# Patient Record
Sex: Female | Born: 1935 | Race: White | Hispanic: No | Smoking: Never smoker
Health system: Southern US, Community
[De-identification: ages and names within clinical notes are randomized; demographics above are authoritative.]

---

## 1999-11-29 ENCOUNTER — Ambulatory Visit (HOSPITAL_COMMUNITY): Admission: RE | Admit: 1999-11-29 | Discharge: 1999-11-29 | Payer: Self-pay | Admitting: Ophthalmology

## 2000-11-20 ENCOUNTER — Encounter: Payer: Self-pay | Admitting: Ophthalmology

## 2000-11-20 ENCOUNTER — Ambulatory Visit (HOSPITAL_COMMUNITY): Admission: RE | Admit: 2000-11-20 | Discharge: 2000-11-20 | Payer: Self-pay | Admitting: Ophthalmology

## 2008-08-10 ENCOUNTER — Ambulatory Visit: Payer: Self-pay | Admitting: Family Medicine

## 2008-08-28 ENCOUNTER — Ambulatory Visit: Payer: Self-pay | Admitting: Family Medicine

## 2009-02-22 ENCOUNTER — Ambulatory Visit: Payer: Self-pay | Admitting: Family Medicine

## 2010-03-11 ENCOUNTER — Ambulatory Visit: Payer: Self-pay | Admitting: Family Medicine

## 2014-03-16 ENCOUNTER — Encounter: Payer: Self-pay | Admitting: Rheumatology

## 2014-04-02 ENCOUNTER — Encounter: Payer: Self-pay | Admitting: Rheumatology

## 2016-02-14 ENCOUNTER — Other Ambulatory Visit: Payer: Self-pay | Admitting: Neurology

## 2016-02-14 DIAGNOSIS — R413 Other amnesia: Secondary | ICD-10-CM

## 2016-02-14 DIAGNOSIS — R42 Dizziness and giddiness: Secondary | ICD-10-CM

## 2016-02-24 ENCOUNTER — Ambulatory Visit: Payer: Self-pay

## 2016-02-26 ENCOUNTER — Ambulatory Visit
Admission: RE | Admit: 2016-02-26 | Discharge: 2016-02-26 | Disposition: A | Discharge status: Home / Self Care | Payer: Medicare Other | Source: Ambulatory Visit | Attending: Neurology | Admitting: Neurology

## 2016-02-26 ENCOUNTER — Encounter: Payer: Self-pay | Admitting: Radiology

## 2016-02-26 DIAGNOSIS — R600 Localized edema: Secondary | ICD-10-CM | POA: Diagnosis not present

## 2016-02-26 DIAGNOSIS — H9311 Tinnitus, right ear: Secondary | ICD-10-CM | POA: Insufficient documentation

## 2016-02-26 DIAGNOSIS — R42 Dizziness and giddiness: Secondary | ICD-10-CM | POA: Diagnosis present

## 2016-02-26 DIAGNOSIS — R413 Other amnesia: Secondary | ICD-10-CM | POA: Diagnosis present

## 2018-05-07 ENCOUNTER — Other Ambulatory Visit: Payer: Self-pay | Admitting: Unknown Physician Specialty

## 2018-05-07 DIAGNOSIS — R1310 Dysphagia, unspecified: Secondary | ICD-10-CM

## 2018-05-10 ENCOUNTER — Ambulatory Visit: Payer: Medicare Other

## 2018-05-10 ENCOUNTER — Ambulatory Visit
Admission: RE | Admit: 2018-05-10 | Discharge: 2018-05-10 | Disposition: A | Discharge status: Home / Self Care | Payer: Medicare Other | Source: Ambulatory Visit | Attending: Unknown Physician Specialty | Admitting: Unknown Physician Specialty

## 2018-05-10 DIAGNOSIS — R1312 Dysphagia, oropharyngeal phase: Secondary | ICD-10-CM | POA: Insufficient documentation

## 2018-05-10 NOTE — Therapy (Signed)
[facility] DIAGNOSTIC RADIOLOGY [address] [place], [place], [address] Phone: [phone]   Fax:     Modified Barium Swallow  Patient Details  Name: Aranda Bihm MRN: [id number] Date of Birth: 02-11-1930 No data recorded  Encounter Date: 05/10/2018    No past medical history on file.    There were no vitals filed for this visit.    Subjective: Patient behavior: (alertness, ability to follow instructions, etc.):  The patient is a poor historian secondary dementia.  She is alert and able to follow simple directions.  Chief complaint: The patient's daughter reports frequent paroxymal coughing spells and globus sensation.   Objective:   Radiological Procedure: A videoflouroscopic evaluation of oral-preparatory, reflex initiation, and pharyngeal phases of the swallow was performed; as well as a screening of the upper esophageal phase.  I. POSTURE: Upright in MBS chair  II. VIEW: Lateral  III. COMPENSATORY STRATEGIES: N/A  IV. BOLUSES ADMINISTERED:   Thin Liquid: 1 small, 3 rapid consecutive   Nectar-thick Liquid: 1 moderate   Honey-thick Liquid: DNT   Puree: 2 teaspoon presentations   Mechanical Soft: 1/4 graham cracker in applesauce  V. RESULTS OF EVALUATION: A. ORAL PREPARATORY PHASE: (The lips, tongue, and velum are observed for strength and coordination)       **Overall Severity Rating: within normal limits   B. SWALLOW INITIATION/REFLEX: (The reflex is normal if "triggered" by the time the bolus reached the base of the tongue)  **Overall Severity Rating: Mild; triggers while falling from the valleculae to the pyriform sinuses  C. PHARYNGEAL PHASE: (Pharyngeal function is normal if the bolus shows rapid, smooth, and continuous transit through the pharynx and there is no pharyngeal residue after the swallow)  **Overall Severity Rating: within normal limits   D. LARYNGEAL PENETRATION: (Material entering into the  laryngeal inlet/vestibule but not aspirated) NONE  E. ASPIRATION: NONE  F. ESOPHAGEAL PHASE: (Screening of the upper esophagus) In the cervical esophagus there is a finger-like protrusion along the posterior wall during swallow (does not impede flow of boluses) consistent with prominent cricopharyngeus.    ASSESSMENT: This 84 year old woman; with complaint of globus sensation and frequent paroxysmal coughing spells; is presenting with minimal oropharyngeal dysphagia characterized by delayed pharyngeal swallow initiation.  Oral control of the bolus including oral hold, rotary mastication, and anterior to posterior transfer is within normal limits.   Aspects of the pharyngeal stage of swallowing including tongue base retraction, hyolaryngeal excursion, epiglottic inversion, and duration/amplitude of UES opening are within normal limits.  There is no observed pharyngeal residue, laryngeal penetration, or tracheal aspiration.  The patient is not at significant risk for prandial aspiration and her complaints do not appear to be due to oropharyngeal swallow function.  In the cervical esophagus there is a finger-like protrusion along the posterior wall during swallow (does not impede flow of boluses) consistent with prominent cricopharyngeus.  The patient may benefit from consult with GI.  PLAN/RECOMMENDATIONS:   A. Diet: Regular   B. Swallowing Precautions: no special precautions indicated   C. Recommended consultation to: GI   D. Therapy recommendations: Speech therapy is not indicated   E. Results and recommendations were discussed with the patient and her daughter immediately following the study and the final report routed to the referring MD.      Oropharyngeal dysphagia - Plan: DG OP Swallowing Func-Medicare/Speech Path, DG OP Swallowing Func-Medicare/Speech Path        Problem List There are no active problems to  display for this patient.  Dollene Primrose, MS/CCC- SLP  Leandrew Koyanagi 05/10/2018, 1:08 PM  [facility] DIAGNOSTIC RADIOLOGY [address] [place], [place], [address] Phone: [phone]   Fax:     Name: Anaiah Mcmannis MRN: [id number] Date of Birth: October 28, 1929

## 2018-05-15 ENCOUNTER — Ambulatory Visit
Admission: RE | Admit: 2018-05-15 | Discharge: 2018-05-15 | Disposition: A | Discharge status: Home / Self Care | Payer: Medicare Other | Source: Ambulatory Visit | Attending: Unknown Physician Specialty | Admitting: Unknown Physician Specialty

## 2018-05-15 DIAGNOSIS — K219 Gastro-esophageal reflux disease without esophagitis: Secondary | ICD-10-CM | POA: Diagnosis not present

## 2018-05-15 DIAGNOSIS — K228 Other specified diseases of esophagus: Secondary | ICD-10-CM | POA: Insufficient documentation

## 2018-05-15 DIAGNOSIS — R1310 Dysphagia, unspecified: Secondary | ICD-10-CM | POA: Diagnosis not present

## 2018-05-15 DIAGNOSIS — K449 Diaphragmatic hernia without obstruction or gangrene: Secondary | ICD-10-CM | POA: Diagnosis not present

## 2019-09-26 ENCOUNTER — Ambulatory Visit: Payer: Medicare Other | Attending: Internal Medicine

## 2019-09-26 DIAGNOSIS — Z23 Encounter for immunization: Secondary | ICD-10-CM

## 2019-09-26 NOTE — Progress Notes (Signed)
   Covid-19 Vaccination Clinic  Name:  Amanpreet Delmont    MRN: [id number] DOB: 10-19-1929  09/26/2019  Ms. Sparr was observed post Covid-19 immunization for 15 minutes without incident. She was provided with Vaccine Information Sheet and instruction to access the V-Safe system.   Ms. Salmi was instructed to call 911 with any severe reactions post vaccine: Marland Kitchen Difficulty breathing  . Swelling of face and throat  . A fast heartbeat  . A bad rash all over body  . Dizziness and weakness   Immunizations Administered    Name Date Dose VIS Date Route   Moderna COVID-19 Vaccine 09/26/2019 10:48 AM 0.5 mL 06/03/2019 Intramuscular   Manufacturer: Moderna   Lot: [id number]   NDC: 62824-175-30

## 2019-10-29 ENCOUNTER — Ambulatory Visit: Payer: Medicare Other | Attending: Internal Medicine

## 2019-10-29 DIAGNOSIS — Z23 Encounter for immunization: Secondary | ICD-10-CM

## 2019-10-29 NOTE — Progress Notes (Signed)
   Covid-19 Vaccination Clinic  Name:  Erika Long    MRN: [id number] DOB: [birth year]  10/29/2019  [name] was observed post Covid-19 immunization for 15 minutes without incident. She was provided with Vaccine Information Sheet and instruction to access the V-Safe system.   [name] was instructed to call 911 with any severe reactions post vaccine: Marland Kitchen Difficulty breathing  . Swelling of face and throat  . A fast heartbeat  . A bad rash all over body  . Dizziness and weakness   Immunizations Administered    Name Date Dose VIS Date Route   Moderna COVID-19 Vaccine 10/29/2019  9:52 AM 0.5 mL 06/2019 Intramuscular   Manufacturer: Moderna   Lot: [id number]   NDC: 53912-258-34

## 2020-10-02 IMAGING — RF DG ESOPHAGUS
11 of 12 series · 14 of 17 positions shown · non-contrast
Comparison: Modified barium swallow May 10, 2018

CLINICAL DATA: Sensation of something sticking in the throat. The
patient expect or aches clear to white phlegm. No reflux of
previously ingested food.

EXAM:
ESOPHOGRAM / BARIUM SWALLOW / BARIUM TABLET STUDY
TECHNIQUE: Combined double contrast and single contrast examination performed
using effervescent crystals, thick barium liquid, and thin barium
liquid. The patient was observed with fluoroscopy swallowing a 13 mm
barium sulphate tablet.
FLUOROSCOPY TIME:  Fluoroscopy Time:  1 minutes, 6 seconds
Radiation Exposure Index (if provided by the fluoroscopic device):
25.3 mGy
Number of Acquired Spot Images: 12

[Series 2: fluoro_barium 2fps_bw · 0.18mm/px · 1 of 1 slices shown (1 of 11)]
[im 1/1]
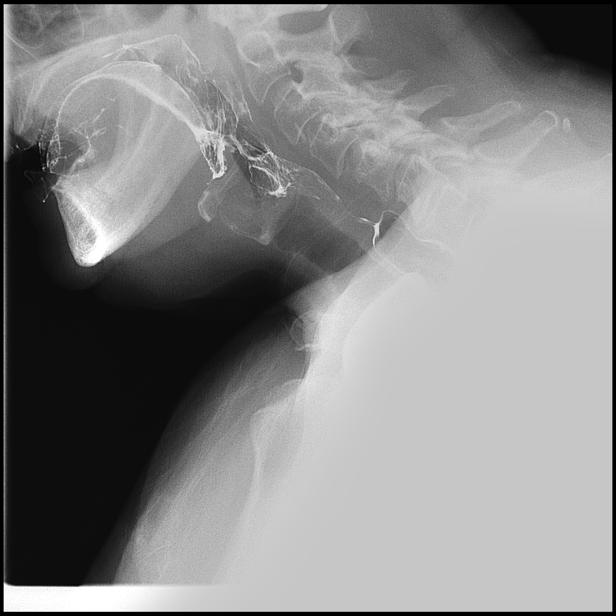

[Series 3: fluoro_barium 2fps_bw · 0.18mm/px · 3 of 11 frames shown (2 of 11)]
[frame 2/11]
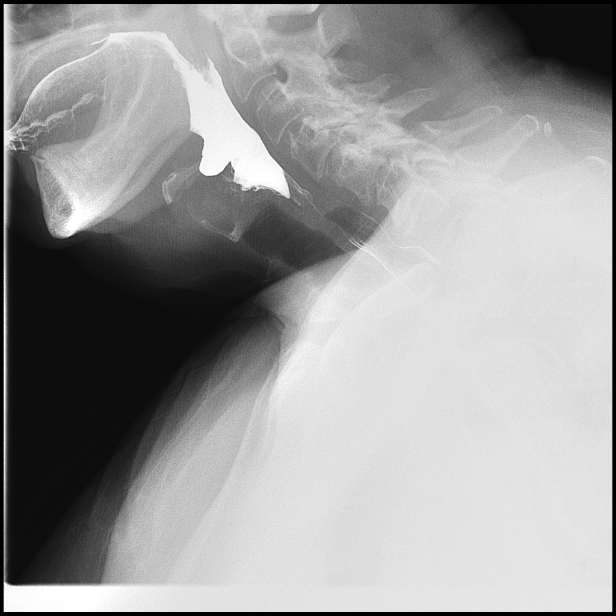
[frame 6/11]
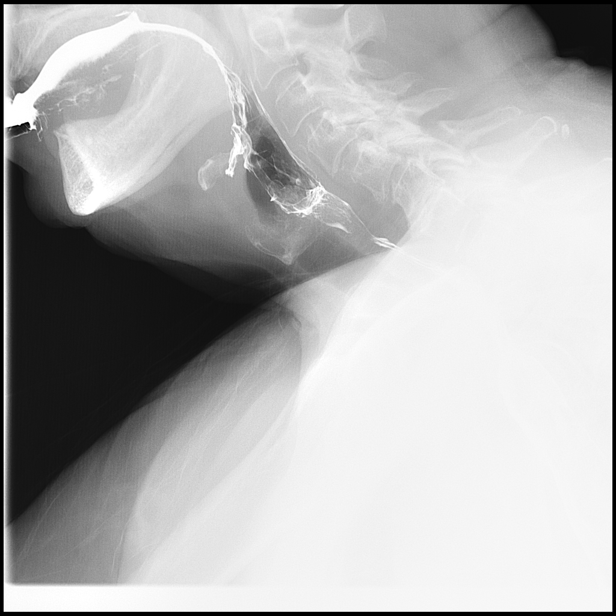
[frame 10/11]
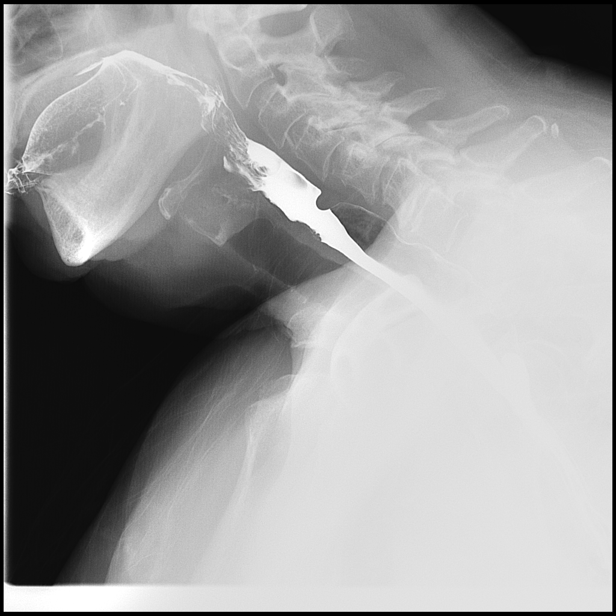

[Series 4: fluoro_barium 2fps_bw · 0.18mm/px · 1 of 1 slices shown (3 of 11)]
[im 1/1]
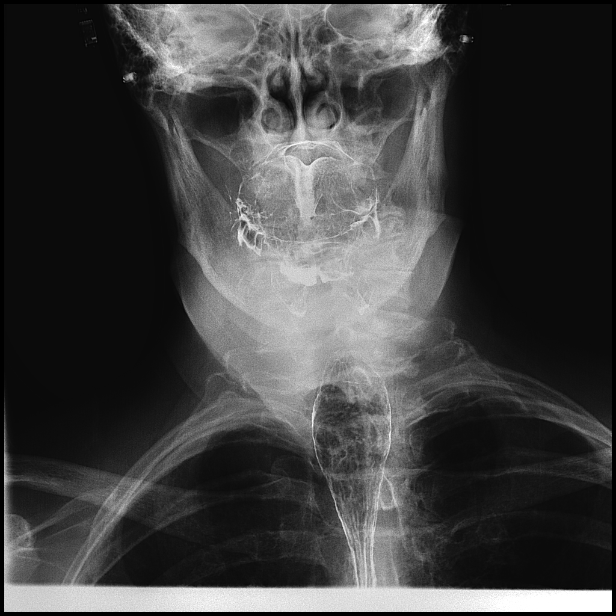

[Series 5: fluoro_barium 2fps_bw · 0.18mm/px · 1 of 1 slices shown (4 of 11)]
[im 1/1]
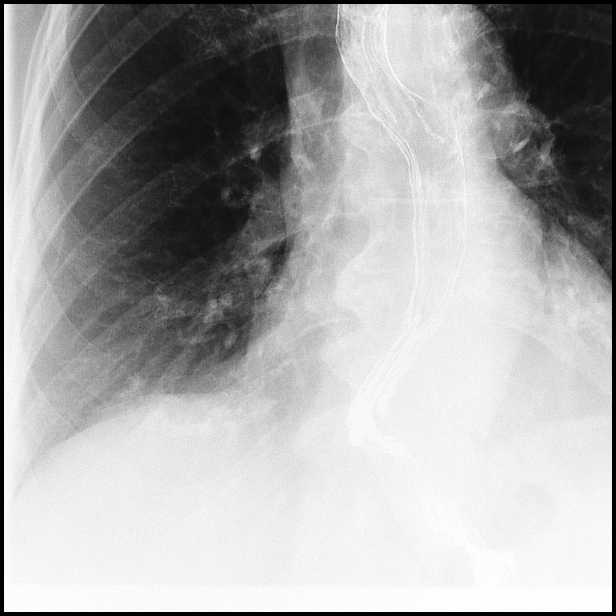

[Series 6: fluoro_barium 2fps_bw · 0.18mm/px · 1 of 1 slices shown (5 of 11)]
[im 1/1]
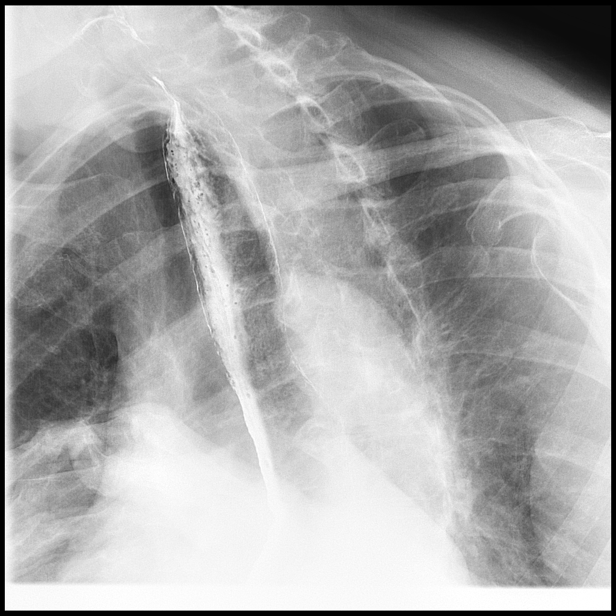

[Series 8: fluoro_barium 2fps_bw · 0.18mm/px · 1 of 1 slices shown (6 of 11)]
[im 1/1]
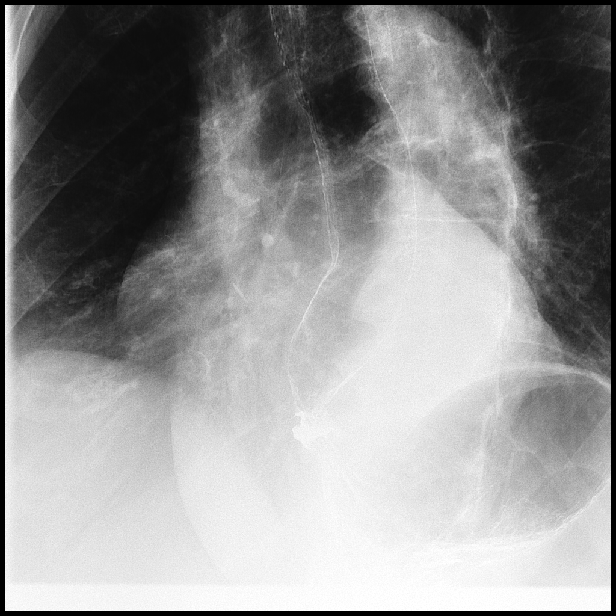

[Series 9: fluoro_barium 2fps_bw · 0.18mm/px · 1 of 1 slices shown (7 of 11)]
[im 1/1]
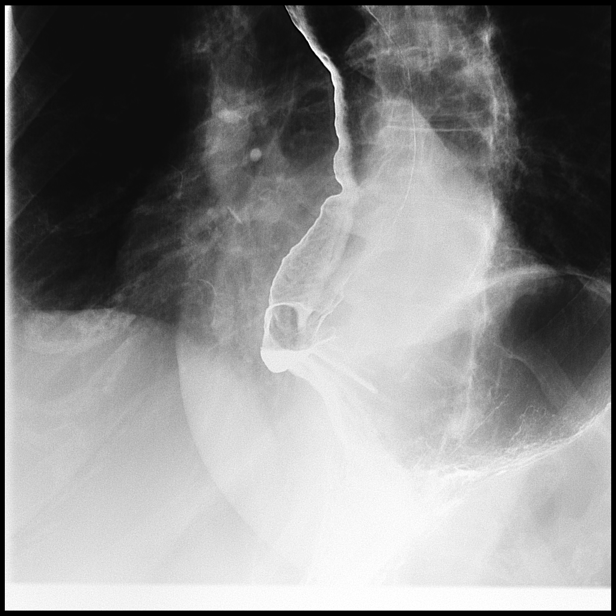

[Series 10: fluoro_barium 2fps_bw · 0.18mm/px · 1 of 1 slices shown (8 of 11)]
[im 1/1]
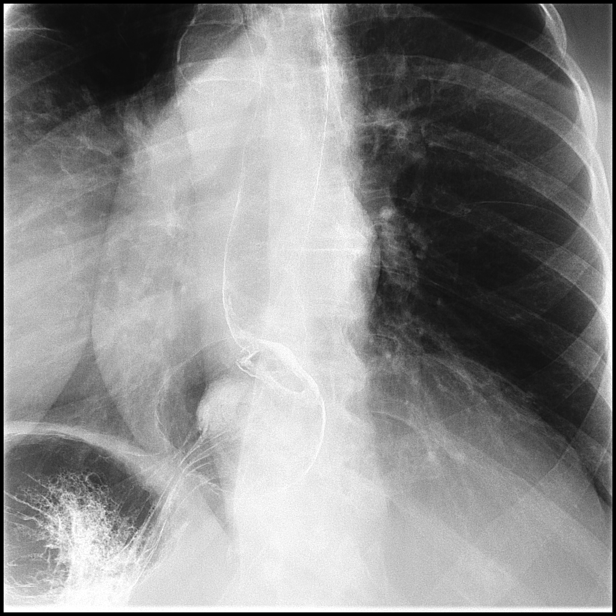

[Series 11: fluoro_barium 2fps_bw · 0.18mm/px · 2 of 20 frames shown (9 of 11)]
[frame 4/20]
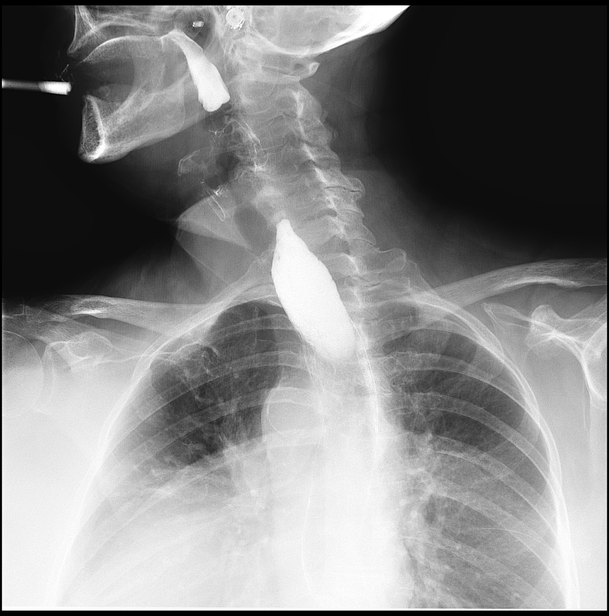
[frame 11/20]
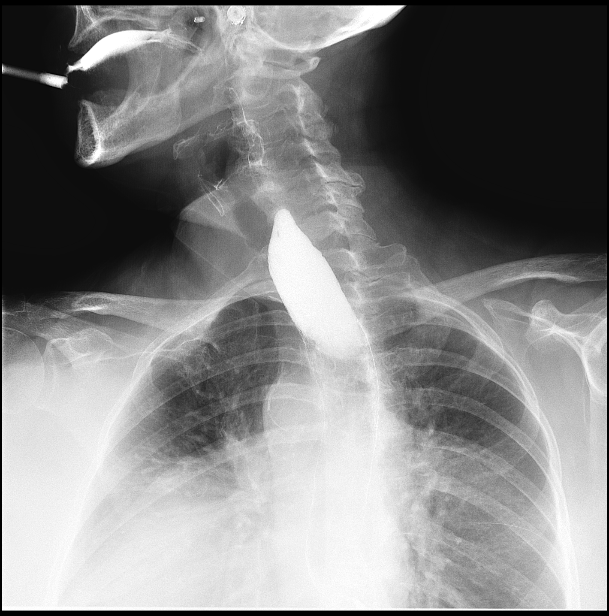

[Series 12: fluoro_barium 2fps_bw · 0.18mm/px · 1 of 1 slices shown (10 of 11)]
[im 1/1]
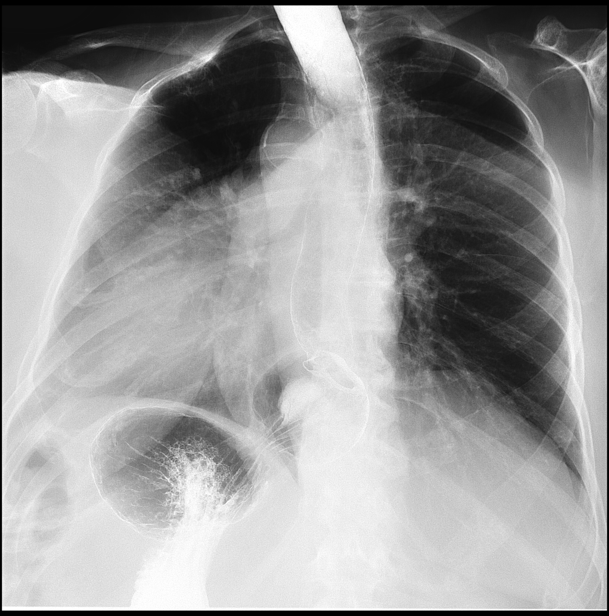

[Series 13: fluoro_barium 2fps_bw · 0.18mm/px · 1 of 1 slices shown (11 of 11)]
[im 1/1]
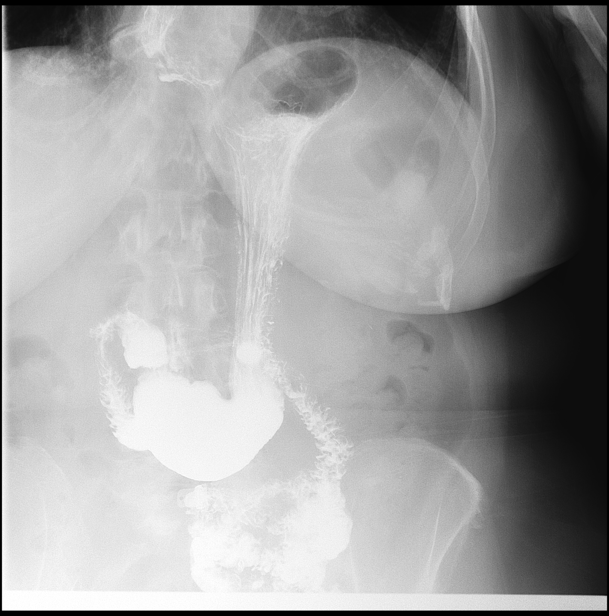

[14 of 17 positions shown; findings below may reference images not displayed]

FINDINGS: The patient ingested thick and thin barium and the gas-forming
crystals without difficulty. The hypopharynx distended well. A
prominent cricopharyngeus muscle impression was observed
intermittently in the cervical esophagus. The thoracic esophagus
distended well. There was a small reducible hiatal hernia. The
barium tablet passed promptly from the mouth to the hiatal hernia
and then into the subdiaphragmatic portion of the stomach. A small
amount of spontaneous gastroesophageal reflux was observed. In the
prone position propagation of the primary and secondary peristaltic
waves was 2 poor.
IMPRESSION: Mild changes of presbyesophagus most pronounced in the prone
position. No fixed stricture nor evidence of esophagitis.

Small reducible hiatal hernia with small amount of spontaneous
gastroesophageal reflux. A prominent cricopharyngeus muscle
impression was observed suggesting chronic reflux.

No laryngeal penetration of the barium.

## 2023-03-28 ENCOUNTER — Ambulatory Visit: Payer: Medicare Other | Admitting: Family Medicine

## 2023-05-09 ENCOUNTER — Encounter: Payer: Self-pay | Admitting: Primary Care

## 2023-05-09 ENCOUNTER — Ambulatory Visit (INDEPENDENT_AMBULATORY_CARE_PROVIDER_SITE_OTHER): Payer: Medicare Other | Admitting: Primary Care

## 2023-05-09 VITALS — BP 128/78 | HR 88 | Temp 97.2°F | Ht 65.0 in | Wt 203.0 lb

## 2023-05-09 DIAGNOSIS — K529 Noninfective gastroenteritis and colitis, unspecified: Secondary | ICD-10-CM | POA: Diagnosis not present

## 2023-05-09 DIAGNOSIS — K219 Gastro-esophageal reflux disease without esophagitis: Secondary | ICD-10-CM | POA: Diagnosis not present

## 2023-05-09 DIAGNOSIS — F3342 Major depressive disorder, recurrent, in full remission: Secondary | ICD-10-CM

## 2023-05-09 DIAGNOSIS — R739 Hyperglycemia, unspecified: Secondary | ICD-10-CM | POA: Diagnosis not present

## 2023-05-09 DIAGNOSIS — R4189 Other symptoms and signs involving cognitive functions and awareness: Secondary | ICD-10-CM | POA: Insufficient documentation

## 2023-05-09 DIAGNOSIS — F329 Major depressive disorder, single episode, unspecified: Secondary | ICD-10-CM | POA: Insufficient documentation

## 2023-05-09 DIAGNOSIS — F0390 Unspecified dementia without behavioral disturbance: Secondary | ICD-10-CM | POA: Insufficient documentation

## 2023-05-09 NOTE — Assessment & Plan Note (Signed)
Symptoms suggestive of dementia.  Continue donepezil 5 mg daily.

## 2023-05-09 NOTE — Progress Notes (Signed)
Subjective:    Patient ID: Erika Long, female    DOB: January 08, 1930, 89 y.o.   MRN: [id number]  HPI  Erika Long is a very pleasant 89 y.o. female with a history of depression, back pain, osteoarthritis, cognitive impairment who presents today who presents today to establish care and discuss the problems mentioned below. Will obtain/review records.  Her daughter presents with her today who helps to provide information for HPI.   1) Depression: Currently managed on sertraline 100 mg daily and trazodone 50 mg at bedtime. She has been managed on sertraline since the passing of her husband in 2004. Her daughter believes that she needs to continue with Zoloft. She is also only taking 25 mg of Trazodone nightly and she sleeps well.   2) Cognitive Impairment: Never officially diagnosed with dementia. Currently managed on donepezil 5 mg HS which was initiated several years ago for symptoms of dementia. Over the years her symptoms have become worse. Her daughter discuss symptoms of repetitive questioning, forgetting recent conversations, inability to follow simple instructions. She is able to prepare some meals, do light cleaning, bathe/dress/toilet herself.   She does not leave the house. Lives with her daughter. She spends most of her day sitting, sleeping, and eating. She experiences exertional dyspnea with mild exertion for which her daughter suspects is secondary to her sedentary lifestyle.    3) GERD: Currently managed on famotidine 40 mg daily which was initiated years ago for a "lump" in her throat. Since then the "lump" has abated. Feels well managed on this regimen.   4) Chronic Diarrhea: Chronic history of "explosive diarrhea" which is reported by the patient to her daughter nearly everyday. Chronic for years. Evaluated by PCP in the past, tested negative for stool infection and H pylori. Her labs did reveal pancreatic insufficiency with pancreatic elastase of 102. She was treated with Zen  Pep which became cost prohibitive after 3 months. She didn't notice improvement in her diarrhea while taking Zen-Pep.   She doesn't leave her home due to fears fecal incontinence and explosive diarrhea. She has urgency of diarrhea/loose stools several times weekly, denies incontinence. Is able to make it to her bathroom. Her daughter has never witnessed these episodes.   She denies nausea, vomiting, abdominal pain, unexplained weight loss, decrease in appetite.  Her daughter mentions that her diet is unhealthy, loves to eat sweets.  BP Readings from Last 3 Encounters:  05/09/23 128/78      Review of Systems  Constitutional:  Positive for fatigue. Negative for appetite change.  Respiratory:  Positive for shortness of breath.   Cardiovascular:  Negative for chest pain.  Gastrointestinal:  Positive for diarrhea. Negative for abdominal pain, blood in stool, nausea and vomiting.  Neurological:        Progressing memory loss  Psychiatric/Behavioral:  Negative for sleep disturbance. The patient is not nervous/anxious.          History reviewed. No pertinent past medical history.  Social History   Socioeconomic History   Marital status: Widowed    Spouse name: Not on file   Number of children: Not on file   Years of education: Not on file   Highest education level: GED or equivalent  Occupational History   Not on file  Tobacco Use   Smoking status: Not on file   Smokeless tobacco: Not on file  Substance and Sexual Activity   Alcohol use: Not on file   Drug use: Not on file   Sexual activity:  Not on file  Other Topics Concern   Not on file  Social History Narrative   Not on file   Social Determinants of Health   Financial Resource Strain: Low Risk  (05/08/2023)   Overall Financial Resource Strain (CARDIA)    Difficulty of Paying Living Expenses: Not hard at all  Food Insecurity: No Food Insecurity (05/08/2023)   Hunger Vital Sign    Worried About Running Out of Food in  the Last Year: Never true    Ran Out of Food in the Last Year: Never true  Transportation Needs: No Transportation Needs (05/08/2023)   PRAPARE - Administrator, Civil Service (Medical): No    Lack of Transportation (Non-Medical): No  Physical Activity: Unknown (05/08/2023)   Exercise Vital Sign    Days of Exercise per Week: 0 days    Minutes of Exercise per Session: Not on file  Stress: Stress Concern Present (05/08/2023)   [employer] of Occupational Health - Occupational Stress Questionnaire    Feeling of Stress : Rather much  Social Connections: Socially Isolated (05/08/2023)   Social Connection and Isolation Panel [NHANES]    Frequency of Communication with Friends and Family: Once a week    Frequency of Social Gatherings with Friends and Family: Never    Attends Religious Services: Never    Database administrator or Organizations: No    Attends Engineer, structural: Not on file    Marital Status: Widowed  Intimate Partner Violence: Not on file    History reviewed. No pertinent surgical history.  History reviewed. No pertinent family history.  Not on File  Current Outpatient Medications on File Prior to Visit  Medication Sig Dispense Refill   Ascorbic Acid (VITAMIN C) 1000 MG tablet Take 1,000 mg by mouth daily.     Cyanocobalamin (B-12) 2500 MCG TABS Take 2,500 mcg by mouth daily.     Donepezil HCl 5 MG/DAY PTWK Take 5 mg by mouth daily.     famotidine (PEPCID) 40 MG tablet Take 40 mg by mouth at bedtime.     sertraline (ZOLOFT) 100 MG tablet Take 100 mg by mouth daily.     traZODone (DESYREL) 50 MG tablet Take 50 mg by mouth at bedtime.     No current facility-administered medications on file prior to visit.    BP 128/78   Pulse 88   Temp (!) 97.2 F (36.2 C) (Temporal)   Ht 5\' 5"  (1.651 m)   Wt 203 lb (92.1 kg)   SpO2 97%   BMI 33.78 kg/m  Objective:   Physical Exam Cardiovascular:     Rate and Rhythm: Normal rate and regular rhythm.   Pulmonary:     Effort: Pulmonary effort is normal.     Breath sounds: Normal breath sounds.  Abdominal:     General: Bowel sounds are normal.     Palpations: Abdomen is soft.     Tenderness: There is no abdominal tenderness.  Musculoskeletal:     Cervical back: Neck supple.  Skin:    General: Skin is warm and dry.  Neurological:     Mental Status: She is alert and oriented to person, place, and time.  Psychiatric:        Mood and Affect: Mood normal.           Assessment & Plan:  Chronic diarrhea Assessment & Plan: Reviewed labs and office notes from March 2024 from prior PCP through [facility].  Repeat pancreatic elastase  ordered and pending. Other labs pending including CBC with differential, CMP, A1c. Thyroid studies reviewed from PCPs office which were negative.  Start Benefiber powder daily to several days weekly. Consider medication side effects of Zoloft.   Await results.  Orders: -     CBC with Differential/Platelet -     Comprehensive metabolic panel -     Pancreatic elastase, fecal; Future  Hyperglycemia -     Hemoglobin A1c; Future  Cognitive impairment Assessment & Plan: Symptoms suggestive of dementia.  Continue donepezil 5 mg daily.   Gastroesophageal reflux disease, unspecified whether esophagitis present Assessment & Plan: Controlled.  Continue famotidine 40 mg daily.   Recurrent major depressive disorder, in full remission [facility]) Assessment & Plan: Controlled.  Discussed the option to wean off, daughter declines today.  Also, consider Zoloft to be contributing to chronic diarrhea. Continue Zoloft 100 mg daily for now.         Doreene Nest, NP

## 2023-05-09 NOTE — Assessment & Plan Note (Signed)
Reviewed labs and office notes from March 2024 from prior PCP through [facility].  Repeat pancreatic elastase ordered and pending. Other labs pending including CBC with differential, CMP, A1c. Thyroid studies reviewed from PCPs office which were negative.  Start Benefiber powder daily to several days weekly. Consider medication side effects of Zoloft.   Await results.

## 2023-05-09 NOTE — Assessment & Plan Note (Signed)
Controlled.  Discussed the option to wean off, daughter declines today.  Also, consider Zoloft to be contributing to chronic diarrhea. Continue Zoloft 100 mg daily for now.

## 2023-05-09 NOTE — Assessment & Plan Note (Signed)
Controlled.  Continue famotidine 40 mg daily.

## 2023-05-09 NOTE — Patient Instructions (Signed)
Stop by the lab prior to leaving today. I will notify you of your results once received.   Notify me when you are out of refills and running low on medications.  Start Benefiber powder once daily or several times weekly for diarrhea/loose stools.  Keep me updated!  It was a pleasure to meet you today! Please don't hesitate to contact me with any questions. Welcome to [facility]!

## 2023-05-10 LAB — COMPREHENSIVE METABOLIC PANEL
ALT: 12 U/L (ref 0–35)
AST: 16 U/L (ref 0–37)
Albumin: 4 g/dL (ref 3.5–5.2)
Alkaline Phosphatase: 67 U/L (ref 39–117)
BUN: 23 mg/dL (ref 6–23)
CO2: 24 meq/L (ref 19–32)
Calcium: 8.8 mg/dL (ref 8.4–10.5)
Chloride: 107 meq/L (ref 96–112)
Creatinine, Ser: 1.17 mg/dL (ref 0.40–1.20)
GFR: 40.8 mL/min — ABNORMAL LOW (ref >60.00)
Glucose, Bld: 101 mg/dL — ABNORMAL HIGH (ref 70–99)
Potassium: 4.2 meq/L (ref 3.5–5.1)
Sodium: 140 meq/L (ref 135–145)
Total Bilirubin: 0.5 mg/dL (ref 0.2–1.2)
Total Protein: 7 g/dL (ref 6.0–8.3)

## 2023-05-10 LAB — CBC WITH DIFFERENTIAL/PLATELET
Basophils Absolute: 0.1 10*3/uL (ref 0.0–0.1)
Basophils Relative: 0.9 % (ref 0.0–3.0)
Eosinophils Absolute: 0.2 10*3/uL (ref 0.0–0.7)
Eosinophils Relative: 2.3 % (ref 0.0–5.0)
HCT: 42.8 % (ref 36.0–46.0)
Hemoglobin: 13.9 g/dL (ref 12.0–15.0)
Lymphocytes Relative: 34.8 % (ref 12.0–46.0)
Lymphs Abs: 2.8 10*3/uL (ref 0.7–4.0)
MCHC: 32.4 g/dL (ref 30.0–36.0)
MCV: 99 fL (ref 78.0–100.0)
Monocytes Absolute: 0.8 10*3/uL (ref 0.1–1.0)
Monocytes Relative: 9.5 % (ref 3.0–12.0)
Neutro Abs: 4.3 10*3/uL (ref 1.4–7.7)
Neutrophils Relative %: 52.5 % (ref 43.0–77.0)
Platelets: 174 10*3/uL (ref 150.0–400.0)
RBC: 4.32 Mil/uL (ref 3.87–5.11)
RDW: 13.6 % (ref 11.5–15.5)
WBC: 8.1 10*3/uL (ref 4.0–10.5)

## 2023-05-10 LAB — HEMOGLOBIN A1C: Hgb A1c MFr Bld: 6.2 % (ref 4.6–6.5)

## 2023-05-23 ENCOUNTER — Other Ambulatory Visit: Payer: Self-pay

## 2023-05-23 DIAGNOSIS — K529 Noninfective gastroenteritis and colitis, unspecified: Secondary | ICD-10-CM

## 2023-05-30 LAB — PANCREATIC ELASTASE, FECAL: Pancreatic Elastase-1, Stool: 491 ug/g (ref >200)

## 2024-01-15 ENCOUNTER — Encounter: Payer: Self-pay | Admitting: Primary Care

## 2024-01-15 ENCOUNTER — Ambulatory Visit (INDEPENDENT_AMBULATORY_CARE_PROVIDER_SITE_OTHER): Admitting: Primary Care

## 2024-01-15 VITALS — BP 110/64 | HR 62 | Temp 97.2°F | Ht 65.0 in | Wt 195.0 lb

## 2024-01-15 DIAGNOSIS — M545 Low back pain, unspecified: Secondary | ICD-10-CM

## 2024-01-15 DIAGNOSIS — K219 Gastro-esophageal reflux disease without esophagitis: Secondary | ICD-10-CM | POA: Diagnosis not present

## 2024-01-15 DIAGNOSIS — G8929 Other chronic pain: Secondary | ICD-10-CM | POA: Insufficient documentation

## 2024-01-15 DIAGNOSIS — F3342 Major depressive disorder, recurrent, in full remission: Secondary | ICD-10-CM

## 2024-01-15 MED ORDER — FAMOTIDINE 40 MG PO TABS: 40.0000 mg | ORAL_TABLET | Freq: Every day | ORAL | 1 refills | Status: DC

## 2024-01-15 MED ORDER — DULOXETINE HCL 30 MG PO CPEP: 30.0000 mg | ORAL_CAPSULE | Freq: Every day | ORAL | 0 refills | Status: DC

## 2024-01-15 NOTE — Progress Notes (Signed)
 Subjective:    Patient ID: Erika Long, female    DOB: 11-Aug-1929, 89 y.o.   MRN: [id number]  Back Pain Pertinent negatives include no numbness or weakness.    Erika Long is a very pleasant 89 y.o. female with a history of cognitive impairment, MDD, GERD, chronic diarrhea who presents today to discuss several concerns.   Her daughter joins us  today who helps to provide information for HPI.  1) Back Pain: Chronic to the mid and right lower back which has been chronic for years. Her pain is only bothersome when she rises from a seated position and when walking. She denies pain when sitting and laying. She does notice intermittent radiation of pain down to her thighs. She denies numbness/tingling.   She's been evaluated by PCP for this complaint several times, was told to get up and walk, exercise, move around. She underwent xrays of the lower back. She spends most of her day at home in the recliner. Lives with her daughter who is frustrated with her sedentary   2) MDD: Currently managed on Zoloft 100 mg daily and trazodone 50 mg at bedtime.  She's been taking Zoloft 100 mg for years, her daughter doesn't believe this works any longer as she is so sedentary and sits in her home all day. The patient denies feeling sad. She's not left her home since November 2024. She is also managed on donepezil for cognitive impairment.   Review of Systems  Musculoskeletal:  Positive for back pain.  Neurological:  Negative for weakness and numbness.  Psychiatric/Behavioral:         See HPI         History reviewed. No pertinent past medical history.  Social History   Socioeconomic History   Marital status: Widowed    Spouse name: Not on file   Number of children: Not on file   Years of education: Not on file   Highest education level: GED or equivalent  Occupational History   Not on file  Tobacco Use   Smoking status: Never   Smokeless tobacco: Never  Substance and Sexual Activity    Alcohol use: Not on file   Drug use: Not on file   Sexual activity: Not on file  Other Topics Concern   Not on file  Social History Narrative   Not on file   Social Drivers of Health   Financial Resource Strain: Low Risk  (05/08/2023)   Overall Financial Resource Strain (CARDIA)    Difficulty of Paying Living Expenses: Not hard at all  Food Insecurity: No Food Insecurity (05/08/2023)   Hunger Vital Sign    Worried About Running Out of Food in the Last Year: Never true    Ran Out of Food in the Last Year: Never true  Transportation Needs: No Transportation Needs (05/08/2023)   PRAPARE - Administrator, Civil Service (Medical): No    Lack of Transportation (Non-Medical): No  Physical Activity: Unknown (05/08/2023)   Exercise Vital Sign    Days of Exercise per Week: 0 days    Minutes of Exercise per Session: Not on file  Stress: Stress Concern Present (05/08/2023)   [employer] of Occupational Health - Occupational Stress Questionnaire    Feeling of Stress : Rather much  Social Connections: Socially Isolated (05/08/2023)   Social Connection and Isolation Panel    Frequency of Communication with Friends and Family: Once a week    Frequency of Social Gatherings with Friends and Family: Never  Attends Religious Services: Never    Active Member of Clubs or Organizations: No    Attends Banker Meetings: Not on file    Marital Status: Widowed  Intimate Partner Violence: Not on file    History reviewed. No pertinent surgical history.  History reviewed. No pertinent family history.  Not on File  Current Outpatient Medications on File Prior to Visit  Medication Sig Dispense Refill   Ascorbic Acid (VITAMIN C) 1000 MG tablet Take 1,000 mg by mouth daily.     Cyanocobalamin (B-12) 2500 MCG TABS Take 2,500 mcg by mouth daily.     traZODone (DESYREL) 50 MG tablet Take 50 mg by mouth at bedtime.     No current facility-administered medications on file prior  to visit.    BP 110/64   Pulse 62   Temp (!) 97.2 F (36.2 C) (Temporal)   Ht 5' 5 (1.651 m)   Wt 195 lb (88.5 kg)   SpO2 97%   BMI 32.45 kg/m  Objective:   Physical Exam Cardiovascular:     Rate and Rhythm: Normal rate.  Pulmonary:     Effort: Pulmonary effort is normal.  Musculoskeletal:     Lumbar back: No deformity, signs of trauma or bony tenderness. Decreased range of motion. Negative right straight leg raise test and negative left straight leg raise test.       Back:           Assessment & Plan:  Chronic bilateral low back pain without sciatica Assessment & Plan: Symptoms and presentation today suggestive of osteoarthritis. No alarm signs on exam.  Family kindly declines x-rays today. Patient declines PT referral.  Recommended that she start getting up and walking at least 5 minutes every day to help improve mobility and reduce joint pain.  Start Cymbalta  30 mg daily for depression but also for pain.  We also discussed Tylenol as needed in the interim. Her daughter will update.  Orders: -     DULoxetine  HCl; Take 1 capsule (30 mg total) by mouth daily. For depression and pain  Dispense: 90 capsule; Refill: 0  Recurrent major depressive disorder, in full remission [facility]) Assessment & Plan: Uncontrolled per daughter. Based on HPI this seems to be true.  Will transition off Zoloft 100 mg and start Cymbalta  30 mg for depression and pain.  Her daughter will update in 1 month.   Orders: -     DULoxetine  HCl; Take 1 capsule (30 mg total) by mouth daily. For depression and pain  Dispense: 90 capsule; Refill: 0  Gastroesophageal reflux disease, unspecified whether esophagitis present Assessment & Plan: Controlled.  Continue famotidine  40 mg HS. Refill sent to pharmacy.  Orders: -     Famotidine ; Take 1 tablet (40 mg total) by mouth at bedtime. for heartburn.  Dispense: 90 tablet; Refill: 1        Comer MARLA Gaskins, NP

## 2024-01-15 NOTE — Assessment & Plan Note (Signed)
 Uncontrolled per daughter. Based on HPI this seems to be true.  Will transition off Zoloft 100 mg and start Cymbalta  30 mg for depression and pain.  Her daughter will update in 1 month.

## 2024-01-15 NOTE — Assessment & Plan Note (Signed)
 Controlled.  Continue famotidine  40 mg HS. Refill sent to pharmacy.

## 2024-01-15 NOTE — Patient Instructions (Signed)
 Stop Zoloft 100 mg for depression.  Start Cymbalta  30 mg once daily for depression and pain.  Start walking everyday, at least 5 minutes.   Please update me in 1 month.  It was a pleasure to see you today!

## 2024-01-15 NOTE — Assessment & Plan Note (Addendum)
 Symptoms and presentation today suggestive of osteoarthritis. No alarm signs on exam.  Family kindly declines x-rays today. Patient declines PT referral.  Recommended that she start getting up and walking at least 5 minutes every day to help improve mobility and reduce joint pain.  Start Cymbalta  30 mg daily for depression but also for pain.  We also discussed Tylenol as needed in the interim. Her daughter will update.

## 2024-03-24 ENCOUNTER — Other Ambulatory Visit: Payer: Self-pay | Admitting: Primary Care

## 2024-03-24 DIAGNOSIS — F3342 Major depressive disorder, recurrent, in full remission: Secondary | ICD-10-CM

## 2024-03-24 DIAGNOSIS — G8929 Other chronic pain: Secondary | ICD-10-CM

## 2024-04-01 ENCOUNTER — Telehealth: Payer: Self-pay

## 2024-04-01 NOTE — Telephone Encounter (Signed)
 Called daughter on dpr. Let her know 90 day was called in on 03/25/24. She will reach out to pharmacy and let us  know if not able to get filled.

## 2024-04-01 NOTE — Telephone Encounter (Signed)
 Copied from CRM #[id number]. Topic: Clinical - Prescription Issue >> Apr 01, 2024  9:03 AM Robinson H wrote: Reason for CRM: Patients daughter calling regarding refill request submitted through pharmacy for the DULoxetine  (CYMBALTA ) 30 MG capsule medication shows discontinued by provider and Darla states patient is leaving on Saturday for 3 months and needs medication. Was able to get the other medication this one had no refills  Darla [phone]

## 2024-07-01 ENCOUNTER — Telehealth: Payer: Self-pay | Admitting: Primary Care

## 2024-07-01 NOTE — Telephone Encounter (Signed)
 Copied from CRM [id number]. Topic: General - Other >> Jul 01, 2024  2:33 PM Wess RAMAN wrote: Reason for CRM: Patient's daughter would like to speak Erika Crank, NP about her concerns of dementia for her mother. She also stated her job will be sending paperwork for FMLA from [employer] absences   Callback #: [id number]

## 2024-07-01 NOTE — Telephone Encounter (Signed)
 Copied from CRM #[id number]. Topic: General - Other >> Jul 01, 2024  1:52 PM Leah C wrote: Reason for CRM: Patient's daughter called in to ask if she can have a phone call where she can discuss with NP Comer about her mom being diagnosed for Dementia. She would to talk about it without patient (mom) in the room, if possible, if in person appointment is recommended or phone call.   [employer] [phone])

## 2024-07-02 NOTE — Telephone Encounter (Signed)
 Noted.   Next steps would be for the family to contact a memory care nursing home to learn about next steps. We can also refer them to a child psychotherapist to help.

## 2024-07-02 NOTE — Telephone Encounter (Addendum)
 Noted. Can we give her daughter a call to learn about her concerns? I see they have an appointment scheduled for 07/09/24.

## 2024-07-02 NOTE — Telephone Encounter (Signed)
 Spoke with pt's, Erika Long (on dpr), asking for details of concerns for pt. States pt lives with her full time but recently went to [facility] to visit other daughter. Upon returning, Erika Long has noticed a significant progression with pt's dementia. Erika Long states she works 2 jobs at night and no longer feels comfortable leaving pt alone during this time. Erika Long states pt needs are becoming more than she can handle so she and the family are thinking it may be time for pt to go to a nursing home. Erika Long is asking for advise and/or recommendations on what to do next. She will be with pt at 07/10/23 OV but doesn't want to discuss all of this in front of pt to prevent agitation.

## 2024-07-04 NOTE — Telephone Encounter (Unsigned)
 Copied from CRM [id number]. Topic: General - Other >> Jul 01, 2024  2:33 PM Wess RAMAN wrote: Reason for CRM: Patient's daughter would like to speak Gretta Crank, NP about her concerns of dementia for her mother. She also stated her job will be sending paperwork for FMLA from [employer] absences   Callback #: [id number] >> Jul 04, 2024  3:23 PM Robinson H wrote: Patients daughter Champ returning call to Patrcia Schnepp at office, please reach back out, thanks.  Darla [phone]

## 2024-07-04 NOTE — Telephone Encounter (Signed)
 Left message to return call to our office for Darla on dpr.

## 2024-07-07 NOTE — Telephone Encounter (Signed)
 Can we give her daughter a call to discuss concerns?  Also, will she be attending the appointment with her mother?

## 2024-07-08 ENCOUNTER — Ambulatory Visit: Admitting: Family Medicine

## 2024-07-08 NOTE — Telephone Encounter (Signed)
 Spoke with pt's daughter, Erika Long (on dpr), relaying both of Erika Long's last 2 messages below. Erika Long verbalizes understanding and states she will further discuss things at pt's OV tomorrow.

## 2024-07-09 ENCOUNTER — Ambulatory Visit: Admitting: Primary Care

## 2024-07-09 ENCOUNTER — Encounter: Payer: Self-pay | Admitting: Primary Care

## 2024-07-09 VITALS — BP 122/66 | HR 78 | Temp 97.9°F | Ht 65.0 in | Wt 194.4 lb

## 2024-07-09 DIAGNOSIS — F03B11 Unspecified dementia, moderate, with agitation: Secondary | ICD-10-CM

## 2024-07-09 DIAGNOSIS — M545 Low back pain, unspecified: Secondary | ICD-10-CM | POA: Diagnosis not present

## 2024-07-09 DIAGNOSIS — G8929 Other chronic pain: Secondary | ICD-10-CM | POA: Diagnosis not present

## 2024-07-09 DIAGNOSIS — Z66 Do not resuscitate: Secondary | ICD-10-CM | POA: Insufficient documentation

## 2024-07-09 DIAGNOSIS — F3342 Major depressive disorder, recurrent, in full remission: Secondary | ICD-10-CM | POA: Diagnosis not present

## 2024-07-09 MED ORDER — DULOXETINE HCL 30 MG PO CPEP: 30.0000 mg | ORAL_CAPSULE | Freq: Every day | ORAL | 3 refills | Status: AC

## 2024-07-09 MED ORDER — HYDROXYZINE HCL 10 MG PO TABS: 10.0000 mg | ORAL_TABLET | Freq: Two times a day (BID) | ORAL | 0 refills | Status: DC | PRN

## 2024-07-09 NOTE — Assessment & Plan Note (Signed)
 Moderate based on HPI and presentation today.  See 6CIT score.   Discussed options with patients daughter. Referral placed to [facility] for social work evaluation. Agree that she needs a higher level of care and is unsafe to care for herself independently. DNR sighed today, patient verbally agrees.   Will also complete intermittent FMLA paperwork for daughter to be excused 3 days weekly for 12 hours each episode.   Start hydroxyzine  10 mg PRN for agitation. Drowsiness precautions provided.

## 2024-07-09 NOTE — Assessment & Plan Note (Addendum)
Controlled.  Continue Cymbalta 30 mg daily. Refills provided.

## 2024-07-09 NOTE — Assessment & Plan Note (Signed)
 Both patient and daughter agree. Form signed.

## 2024-07-09 NOTE — Progress Notes (Signed)
 "  Subjective:    Patient ID: Erika Long, female    DOB: 1929-11-29, 89 y.o.   MRN: [id number]  Erika Long is a very pleasant 89 y.o. female with a history of cognitive impairment, chronic diarrhea, GERD, low back pain, MDD who presents today for medication refills and dementia concerns.   Her daughter joins us  today who provides information for HPI as patient is mostly non contributory.   Patient lives with her daughter full-time, but recently patient visited her other daughter in [place]  for a few months.  Upon returning home her daughter noticed a significant progression with memory loss/dementia.  Examples include extreme agitation daily, inability to fix meals independently and safely, recently put a metal pan in the microwave which destroyed her microwave, no recollection of recent conversations, mixing up day and night, medication mistakes. She has been sleeping well until last night when she got up in the middle of the night to get dressed for her appointment today.  Her daughter is needing intermittent FMLA to help care for her mother, leave work to assist her mother if needed, put her mother to bed. She is requesting to go into work from 9 pm to 9 am, her employer is working with her on this. She is requesting intermittent FMLA for 3 days weekly lasting 12 hours until she is situated in a memory care unit.   Her daughter works 2 jobs at night and no longer feels comfortable leaving her mother at home during the day.  They are considering skilled nursing facility for memory care.   Currently managed on duloxetine  30 mg daily for depression and pain.  Historically she has been well on this regimen.  She is also managed on trazodone 50 mg at bedtime.   Review of Systems  Respiratory:  Negative for shortness of breath.   Cardiovascular:  Negative for chest pain.  Neurological:  Negative for dizziness and headaches.  Psychiatric/Behavioral:  Positive for agitation and confusion.  Negative for sleep disturbance.          History reviewed. No pertinent past medical history.  Social History   Socioeconomic History   Marital status: Widowed    Spouse name: Not on file   Number of children: Not on file   Years of education: Not on file   Highest education level: GED or equivalent  Occupational History   Not on file  Tobacco Use   Smoking status: Never   Smokeless tobacco: Never  Substance and Sexual Activity   Alcohol use: Not on file   Drug use: Not on file   Sexual activity: Not on file  Other Topics Concern   Not on file  Social History Narrative   Not on file   Social Drivers of Health   Tobacco Use: Low Risk (07/09/2024)   Patient History    Smoking Tobacco Use: Never    Smokeless Tobacco Use: Never    Passive Exposure: Not on file  Financial Resource Strain: Low Risk (05/08/2023)   Overall Financial Resource Strain (CARDIA)    Difficulty of Paying Living Expenses: Not hard at all  Food Insecurity: No Food Insecurity (05/08/2023)   Hunger Vital Sign    Worried About Running Out of Food in the Last Year: Never true    Ran Out of Food in the Last Year: Never true  Transportation Needs: No Transportation Needs (05/08/2023)   PRAPARE - Administrator, Civil Service (Medical): No    Lack of Transportation (Non-Medical):  No  Physical Activity: Unknown (05/08/2023)   Exercise Vital Sign    Days of Exercise per Week: 0 days    Minutes of Exercise per Session: Not on file  Stress: Stress Concern Present (05/08/2023)   [employer] of Occupational Health - Occupational Stress Questionnaire    Feeling of Stress : Rather much  Social Connections: Socially Isolated (05/08/2023)   Social Connection and Isolation Panel    Frequency of Communication with Friends and Family: Once a week    Frequency of Social Gatherings with Friends and Family: Never    Attends Religious Services: Never    Database Administrator or Organizations: No     Attends Engineer, Structural: Not on file    Marital Status: Widowed  Intimate Partner Violence: Not on file  Depression (PHQ2-9): High Risk (01/15/2024)   Depression (PHQ2-9)    PHQ-2 Score: 17  Alcohol Screen: Not on file  Housing: Low Risk (05/08/2023)   Housing    Last Housing Risk Score: 0  Utilities: Patient Declined (09/19/2022)   Received from [facility] Utilities    Threatened with loss of utilities: Patient declined  Health Literacy: Not on file    History reviewed. No pertinent surgical history.  History reviewed. No pertinent family history.  Allergies[1]  Medications Ordered Prior to Encounter[2]  BP 122/66   Pulse 78   Temp 97.9 F (36.6 C) (Oral)   Ht 5' 5 (1.651 m)   Wt 194 lb 6 oz (88.2 kg)   SpO2 98%   BMI 32.35 kg/m  Objective:   Physical Exam Cardiovascular:     Rate and Rhythm: Normal rate and regular rhythm.  Pulmonary:     Effort: Pulmonary effort is normal.     Breath sounds: Normal breath sounds.  Abdominal:     General: Bowel sounds are normal.     Palpations: Abdomen is soft.     Tenderness: There is no abdominal tenderness.  Skin:    General: Skin is warm and dry.  Neurological:     Mental Status: She is alert.     Comments: During visit patient does not recollect several parts of our conversation. She believes it is the year 2004. See 6CIT score.  Psychiatric:        Mood and Affect: Mood normal.     Physical Exam        Assessment & Plan:  Moderate dementia with agitation, unspecified dementia type [facility]) Assessment & Plan: Moderate based on HPI and presentation today.  See 6CIT score.   Discussed options with patients daughter. Referral placed to [facility] for social work evaluation. Agree that she needs a higher level of care and is unsafe to care for herself independently. DNR sighed today, patient verbally agrees.   Will also complete intermittent FMLA paperwork for daughter to be excused  3 days weekly for 12 hours each episode.   Start hydroxyzine  10 mg PRN for agitation. Drowsiness precautions provided.   Orders: -     AMB Referral [facility]I Care Management -     hydrOXYzine  HCl; Take 1 tablet (10 mg total) by mouth 2 (two) times daily as needed for anxiety.  Dispense: 60 tablet; Refill: 0  Chronic bilateral low back pain without sciatica -     DULoxetine  HCl; Take 1 capsule (30 mg total) by mouth daily. For depression  Dispense: 90 capsule; Refill: 3  Recurrent major depressive disorder, in full remission Assessment & Plan: Controlled.  Continue Cymbalta  30 mg daily. Refills provided.   Orders: -     DULoxetine  HCl; Take 1 capsule (30 mg total) by mouth daily. For depression  Dispense: 90 capsule; Refill: 3  DNR (do not resuscitate) Assessment & Plan: Both patient and daughter agree. Form signed.      Assessment and Plan Assessment & Plan         Comer MARLA Gaskins, NP       [1] Not on File [2]  Current Outpatient Medications on File Prior to Visit  Medication Sig Dispense Refill   Ascorbic Acid (VITAMIN C) 1000 MG tablet Take 1,000 mg by mouth daily.     Cyanocobalamin (B-12) 2500 MCG TABS Take 2,500 mcg by mouth daily.     No current facility-administered medications on file prior to visit.   "

## 2024-07-09 NOTE — Patient Instructions (Signed)
 You will receive a phone call regarding the social work referral.  Rosine may take hydroxyzine  10 mg twice daily as needed for anxiety/agitation.    It was a pleasure to see you today!

## 2024-07-10 ENCOUNTER — Telehealth: Payer: Self-pay | Admitting: Primary Care

## 2024-07-10 NOTE — Telephone Encounter (Signed)
 Spoke with pt's daughter, Darla (on dpr), notifying her we haven't received her FMLA ppw yet. I provided our office fax # [phone] and she will have her job fax form.

## 2024-07-10 NOTE — Telephone Encounter (Signed)
 Copied from CRM #[id number]. Topic: General - Other >> Jul 10, 2024  1:30 PM Victoria A wrote: Reason for CRM: Patient's daughter called regarding FMLA document to [employer] [phone] -Darla said if the form is not found she can have another sent but to contact [phone]

## 2024-07-15 ENCOUNTER — Telehealth: Payer: Self-pay

## 2024-07-15 DIAGNOSIS — F03B11 Unspecified dementia, moderate, with agitation: Secondary | ICD-10-CM

## 2024-07-15 NOTE — Progress Notes (Signed)
 Complex Care Management Note  Care Guide Note 07/15/2024 Name: Erika Long MRN: [id number] DOB: Aug 26, 1929  Erika Long is a 89 y.o. year old female who sees Gretta Comer POUR, NP for primary care. I reached out to Heron Domino by phone today to offer complex care management services.  Erika Long was given information about Complex Care Management services today including:   The Complex Care Management services include support from the care team which includes your Nurse Care Manager, Clinical Social Worker, or Pharmacist.  The Complex Care Management team is here to help remove barriers to the health concerns and goals most important to you. Complex Care Management services are voluntary, and the patient may decline or stop services at any time by request to their care team member.   Complex Care Management Consent Status: Patient agreed to services and verbal consent obtained.   Follow up plan:  Telephone appointment with complex care management team member scheduled for:  07/25/24 at 11:00 a.m.   Encounter Outcome:  Patient Scheduled  Erika Long [facility]  [facility], [facility] VBCI Assistant Direct Dial: [phone]  Fax: [phone]

## 2024-07-16 NOTE — Telephone Encounter (Signed)
 Noted (see other messages by clicking blue '[address] [place]' link).

## 2024-07-22 ENCOUNTER — Telehealth: Payer: Self-pay | Admitting: Primary Care

## 2024-07-22 NOTE — Telephone Encounter (Signed)
 Patients daughter dropped off FMLA ppwk put in specialist box at front desk.

## 2024-07-22 NOTE — Telephone Encounter (Signed)
 Forms received and faxed to [employer] at [phone]. Confirmation fax received.   Copy sent to scan  Provider notified patients daughter via MyChart that forms are being faxed and that a copy was made for her records.

## 2024-07-22 NOTE — Telephone Encounter (Signed)
 Olam, will you fax? Placed in Kelli's inbox.

## 2024-07-25 ENCOUNTER — Other Ambulatory Visit: Payer: Self-pay | Admitting: *Deleted

## 2024-07-26 NOTE — Patient Outreach (Signed)
 Complex Care Management   Visit Note  07/26/2024  Name:  Erika Long MRN: [id number] DOB: 1929/12/22  Situation: Referral received for Complex Care Management related to Dementia I obtained verbal consent from Caregiver.  Visit completed with Caregiver  on the phone on 07/25/24  Background:  No past medical history on file.  Assessment: Patient's daughter's contacted by phone to discuss safety concerns related to patient's cognition. Patient's agitation and irritability specifically at night is progressing as well as episodes of disorientation. Patient's daughter's interested in long term assisted lving care for patient at this time to maintain her safety Patient Reported Symptoms:  Cognitive Cognitive Status: Confused or disoriented, Poor judgment in daily scenarios, Requires Assistance Decision Making   Health Maintenance Behaviors: Annual physical exam Healing Pattern: Slow Health Facilitated by: Rest  Neurological Neurological Review of Symptoms: No symptoms reported    HEENT HEENT Symptoms Reported: Change or loss of hearing (has hearing aids but does not use them) HEENT Management Strategies: Medical device    Cardiovascular Cardiovascular Symptoms Reported: No symptoms reported    Respiratory Respiratory Symptoms Reported: No symptoms reported    Endocrine Endocrine Symptoms Reported: No symptoms reported Is patient diabetic?: No    Gastrointestinal Gastrointestinal Symptoms Reported: Incontinence Gastrointestinal Management Strategies: Coping strategies    Genitourinary Genitourinary Symptoms Reported: Incontinence Genitourinary Management Strategies: Incontinence garment/pad  Integumentary Integumentary Symptoms Reported: No symptoms reported    Musculoskeletal Musculoskelatal Symptoms Reviewed: Difficulty walking, Unsteady gait, Weakness Additional Musculoskeletal Details: walker and cain when ambulating, sits in recliner and sleeps in reclined during the day-refuses  to leave the house-does not walk far at all-cannot stand very along-complains of lower back pain when standing for long periods of time Musculoskeletal Management Strategies: Medical device Falls in the past year?: Yes Number of falls in past year: 1 or less Was there an injury with Fall?: No Fall Risk Category Calculator: 1 Patient Fall Risk Level: Low Fall Risk    Psychosocial Psychosocial Symptoms Reported: Irritability     Quality of Family Relationships: supportive Do you feel physically threatened by others?: No    07/26/2024    PHQ2-9 Depression Screening   Little interest or pleasure in doing things    Feeling down, depressed, or hopeless    PHQ-2 - Total Score    Trouble falling or staying asleep, or sleeping too much    Feeling tired or having little energy    Poor appetite or overeating     Feeling bad about yourself - or that you are a failure or have let yourself or your family down    Trouble concentrating on things, such as reading the newspaper or watching television    Moving or speaking so slowly that other people could have noticed.  Or the opposite - being so fidgety or restless that you have been moving around a lot more than usual    Thoughts that you would be better off dead, or hurting yourself in some way    PHQ2-9 Total Score    If you checked off any problems, how difficult have these problems made it for you to do your work, take care of things at home, or get along with other people    Depression Interventions/Treatment      There were no vitals filed for this visit. Pain Scale: 0-10 Pain Score: 0-No pain  Medications Reviewed Today     Reviewed by Ermalinda Lenn HERO, LCSW (Social Worker) on 07/26/24 at 1542  Med List Status: <None>  Medication Order Taking? Sig Documenting Provider Last Dose Status Informant  Ascorbic Acid (VITAMIN C) 1000 MG tablet [id number] Yes Take 1,000 mg by mouth daily. [provider]  Active   Cyanocobalamin  (B-12) 2500 MCG TABS [id number] Yes Take 2,500 mcg by mouth daily. [provider]  Active   DULoxetine  (CYMBALTA ) 30 MG capsule [id number] Yes Take 1 capsule (30 mg total) by mouth daily. For depression Clark, Katherine K, NP  Active   hydrOXYzine  (ATARAX ) 10 MG tablet [id number] Yes Take 1 tablet (10 mg total) by mouth 2 (two) times daily as needed for anxiety. Gretta Comer POUR, NP  Active             Recommendation:   PCP Follow-up Long term care placement  Follow Up Plan:   Telephone follow up appointment date/time:  08/21/24 1:30pm  Divit Stipp Ermalinda HUGHS [place]  [facility], [facility] Licensed Clinical Social Worker  Direct Dial: [phone]

## 2024-07-26 NOTE — Patient Instructions (Signed)
 Visit Information  Thank you for taking time to visit with me today. Please don't hesitate to contact me if I can be of assistance to you before our next scheduled appointment Our next appointment is by telephone on 08/11/24 at 1:30pm Please call the care guide team at [phone] if you need to cancel or reschedule your appointment.   Following is a copy of your care plan:   Goals Addressed             This Visit's Progress    VBCI Social Work Care Plan       Problems:   Cognitive Deficits  CSW Clinical Goal(s):   Over the next 90 days the Caregiver  will follow up with facility of choice as directed by Social Work to secure long term care placement for patient evidenced by caregiver report              Over the next 30 days CSW will obtain completed Fl2 from provider's office and will fax out for possible bed offers evidenced by CSW report  Interventions:  Level of Care Concerns in a patient with Dementia Current level of care: Home with other family or significant other(s): family member: daughter  Evaluation of patient's unmet needs in current living environment  Facility  Assessed needs and reviewed facility placement process; as well as the different levels of care Discussed payment options for placement :discussed spend down process and need to apply for special assistance medicaid once spend down process is complete Emotional Support Provided Obtained verbal permission to fax FL2 and supporting document to facilities as well as obtain PASSAR once FL2 is received from program Provided a list of facilities based on level of care needs via email Reviewed facility placement process :encouraged scheduling tours once options are narrowed down  Patient Goals/Self-Care Activities:  Patient's daughter will review list of Assisted Living options once received   Plan:   Telephone follow up appointment with care management team member scheduled for:  08/11/24 1:30pm         Please call 911 if you are experiencing a Mental Health or Behavioral Health Crisis or need someone to talk to.  Health Care Power of Attorney Erika Long and Erika Long-daughters verbalized understanding of Care plan and visit instructions communicated this visit  Erika Custis, LCSW [place]  [facility], [facility] Licensed Clinical Social Worker  Direct Dial: [phone]

## 2024-08-04 ENCOUNTER — Other Ambulatory Visit: Payer: Self-pay | Admitting: Primary Care

## 2024-08-04 DIAGNOSIS — F03B11 Unspecified dementia, moderate, with agitation: Secondary | ICD-10-CM

## 2024-08-07 MED ORDER — HYDROXYZINE HCL 10 MG PO TABS: 10.0000 mg | ORAL_TABLET | Freq: Two times a day (BID) | ORAL | 0 refills | Status: AC | PRN

## 2024-08-07 NOTE — Addendum Note (Signed)
 Addended by: Bianco Cange K on: 08/07/2024 01:40 PM   Modules accepted: Orders

## 2024-08-11 ENCOUNTER — Telehealth: Admitting: *Deleted
